# Patient Record
Sex: Male | Born: 1995 | Race: Black or African American | Hispanic: No | Marital: Single
Health system: Southern US, Community
[De-identification: ages and names within clinical notes are randomized; demographics above are authoritative.]

## PROBLEM LIST (undated history)

## (undated) DIAGNOSIS — J45909 Unspecified asthma, uncomplicated: Secondary | ICD-10-CM

---
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
## 2016-11-15 ENCOUNTER — Emergency Department (HOSPITAL_COMMUNITY)
Admission: EM | Admit: 2016-11-15 | Discharge: 2016-11-15 | Disposition: A | Discharge status: Home / Self Care | Payer: Medicaid Other | Attending: Emergency Medicine | Admitting: Emergency Medicine

## 2016-11-15 ENCOUNTER — Encounter (HOSPITAL_COMMUNITY): Payer: Self-pay | Admitting: Emergency Medicine

## 2016-11-15 DIAGNOSIS — W541XXA Struck by dog, initial encounter: Secondary | ICD-10-CM | POA: Insufficient documentation

## 2016-11-15 DIAGNOSIS — Y92013 Bedroom of single-family (private) house as the place of occurrence of the external cause: Secondary | ICD-10-CM | POA: Diagnosis not present

## 2016-11-15 DIAGNOSIS — S3802XA Crushing injury of scrotum and testis, initial encounter: Secondary | ICD-10-CM | POA: Diagnosis present

## 2016-11-15 DIAGNOSIS — Y939 Activity, unspecified: Secondary | ICD-10-CM | POA: Insufficient documentation

## 2016-11-15 DIAGNOSIS — Y999 Unspecified external cause status: Secondary | ICD-10-CM | POA: Diagnosis not present

## 2016-11-15 DIAGNOSIS — Z7722 Contact with and (suspected) exposure to environmental tobacco smoke (acute) (chronic): Secondary | ICD-10-CM | POA: Insufficient documentation

## 2016-11-15 DIAGNOSIS — Z79899 Other long term (current) drug therapy: Secondary | ICD-10-CM | POA: Insufficient documentation

## 2016-11-15 DIAGNOSIS — S3022XA Contusion of scrotum and testes, initial encounter: Secondary | ICD-10-CM | POA: Diagnosis not present

## 2016-11-15 DIAGNOSIS — J4520 Mild intermittent asthma, uncomplicated: Secondary | ICD-10-CM | POA: Insufficient documentation

## 2016-11-15 HISTORY — DX: Unspecified asthma, uncomplicated: J45.909

## 2016-11-15 MED ORDER — ALBUTEROL SULFATE HFA 108 (90 BASE) MCG/ACT IN AERS
2.0000 | INHALATION_SPRAY | Freq: Once | RESPIRATORY_TRACT | Status: AC
  Administered 2016-11-15: 2 via RESPIRATORY_TRACT
  Filled 2016-11-15: qty 6.7

## 2016-11-15 NOTE — ED Triage Notes (Signed)
Patient presents with mother c/o worsening asthma exacerbation since yesterday after mowing grass. Pt also adds testicular soreness since yesterday. Pt denies any trauma.
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
## 2017-01-05 DIAGNOSIS — J4540 Moderate persistent asthma, uncomplicated: Secondary | ICD-10-CM | POA: Insufficient documentation

## 2017-01-05 DIAGNOSIS — R002 Palpitations: Secondary | ICD-10-CM | POA: Diagnosis not present

## 2017-01-05 DIAGNOSIS — Z7722 Contact with and (suspected) exposure to environmental tobacco smoke (acute) (chronic): Secondary | ICD-10-CM | POA: Diagnosis not present

## 2017-01-06 ENCOUNTER — Emergency Department (HOSPITAL_COMMUNITY): Payer: Medicaid Other

## 2017-01-06 ENCOUNTER — Emergency Department (HOSPITAL_COMMUNITY)
Admission: EM | Admit: 2017-01-06 | Discharge: 2017-01-06 | Disposition: A | Discharge status: Home / Self Care | Payer: Medicaid Other | Attending: Emergency Medicine | Admitting: Emergency Medicine

## 2017-01-06 ENCOUNTER — Encounter (HOSPITAL_COMMUNITY): Payer: Self-pay | Admitting: Emergency Medicine

## 2017-01-06 DIAGNOSIS — R002 Palpitations: Secondary | ICD-10-CM

## 2017-01-06 LAB — BASIC METABOLIC PANEL
ANION GAP: 8 (ref 5–15)
BUN: 6 mg/dL (ref 6–20)
CHLORIDE: 104 mmol/L (ref 101–111)
CO2: 25 mmol/L (ref 22–32)
Calcium: 9.2 mg/dL (ref 8.9–10.3)
Creatinine, Ser: 0.91 mg/dL (ref 0.61–1.24)
GFR calc non Af Amer: >60 mL/min (ref >60)
Glucose, Bld: 122 mg/dL — ABNORMAL HIGH (ref 65–99)
POTASSIUM: 3.3 mmol/L — AB (ref 3.5–5.1)
SODIUM: 137 mmol/L (ref 135–145)

## 2017-01-06 LAB — CBC
HEMATOCRIT: 43.8 % (ref 39.0–52.0)
Hemoglobin: 14.4 g/dL (ref 13.0–17.0)
MCH: 25.8 pg — ABNORMAL LOW (ref 26.0–34.0)
MCHC: 32.9 g/dL (ref 30.0–36.0)
MCV: 78.5 fL (ref 78.0–100.0)
Platelets: 240 10*3/uL (ref 150–400)
RBC: 5.58 MIL/uL (ref 4.22–5.81)
RDW: 13.7 % (ref 11.5–15.5)
WBC: 4.3 10*3/uL (ref 4.0–10.5)

## 2017-01-06 LAB — I-STAT TROPONIN, ED: Troponin i, poc: 0 ng/mL (ref 0.00–0.08)

## 2017-01-06 MED ORDER — POTASSIUM CHLORIDE CRYS ER 20 MEQ PO TBCR
40.0000 meq | EXTENDED_RELEASE_TABLET | Freq: Once | ORAL | Status: AC
  Administered 2017-01-06: 40 meq via ORAL
  Filled 2017-01-06: qty 2

## 2017-01-06 NOTE — Discharge Instructions (Signed)
As discussed, make sure that he stay well-hydrated drink plenty of water to keep your urine clear. Follow-up with cardiology for possible Holter monitor to evaluate for palpitations.  Your EKG and cardiac enzymes were normal today.  Make sure to follow-up with your primary care provider to evaluate blood pressure under less stressful circumstances. Return to the emergency department if she experienced difficulty breathing, chest pain, palpitations or other concerning symptoms in the meantime.

## 2017-01-06 NOTE — ED Triage Notes (Signed)
Pt reports left sided chest pain tonight.  No history, denies loc, n/v/d, weakness and diaphoresis.  Does report a "little sob".

## 2017-01-06 NOTE — ED Notes (Signed)
Pt and family understood dc material. NAD Noted 
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
## 2017-03-04 ENCOUNTER — Encounter (HOSPITAL_COMMUNITY): Payer: Self-pay | Admitting: Emergency Medicine

## 2017-03-04 ENCOUNTER — Emergency Department (HOSPITAL_COMMUNITY)
Admission: EM | Admit: 2017-03-04 | Discharge: 2017-03-04 | Disposition: A | Discharge status: Home / Self Care | Payer: Medicaid Other | Attending: Emergency Medicine | Admitting: Emergency Medicine

## 2017-03-04 DIAGNOSIS — J45909 Unspecified asthma, uncomplicated: Secondary | ICD-10-CM | POA: Insufficient documentation

## 2017-03-04 DIAGNOSIS — Z7722 Contact with and (suspected) exposure to environmental tobacco smoke (acute) (chronic): Secondary | ICD-10-CM | POA: Insufficient documentation

## 2017-03-04 DIAGNOSIS — I10 Essential (primary) hypertension: Secondary | ICD-10-CM | POA: Insufficient documentation

## 2017-03-04 NOTE — ED Triage Notes (Signed)
Patient in from home. Mom is speaker. Reports that patient has been having increased blood pressure readings x 1 week. Natural OTC remedies, including vinegar and water, cinnamon, with no relief.
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
## 2017-03-04 NOTE — Discharge Instructions (Signed)
You were evaluated in the ED for high blood pressure readings at home. Your blood pressure here was slightly elevated, but your physical exam was reassuring and did not suggest evidence of end-organ damage as a result of high blood pressure.  Please read the attached instructions regarding the appropriate diet for managing high blood pressure at home. Please avoid foods that are high in salt and I recommend starting a daily exercise regimen at home. Please use your albuterol inhaler as needed. You need to use this inhaler when you feel short of breath. You do not need to use this inhaler every day.   Please call the family medicine clinic to re-establish care and to help manage your chronic medical conditions.

## 2019-04-03 IMAGING — CR DG CHEST 2V
2 series · 2 of 2 positions shown · non-contrast
Comparison: Prior radiograph from 06/05/2005.

CLINICAL DATA: Initial evaluation for acute chest pain, heart
racing.

EXAM:
CHEST  2 VIEW

[chest pa]
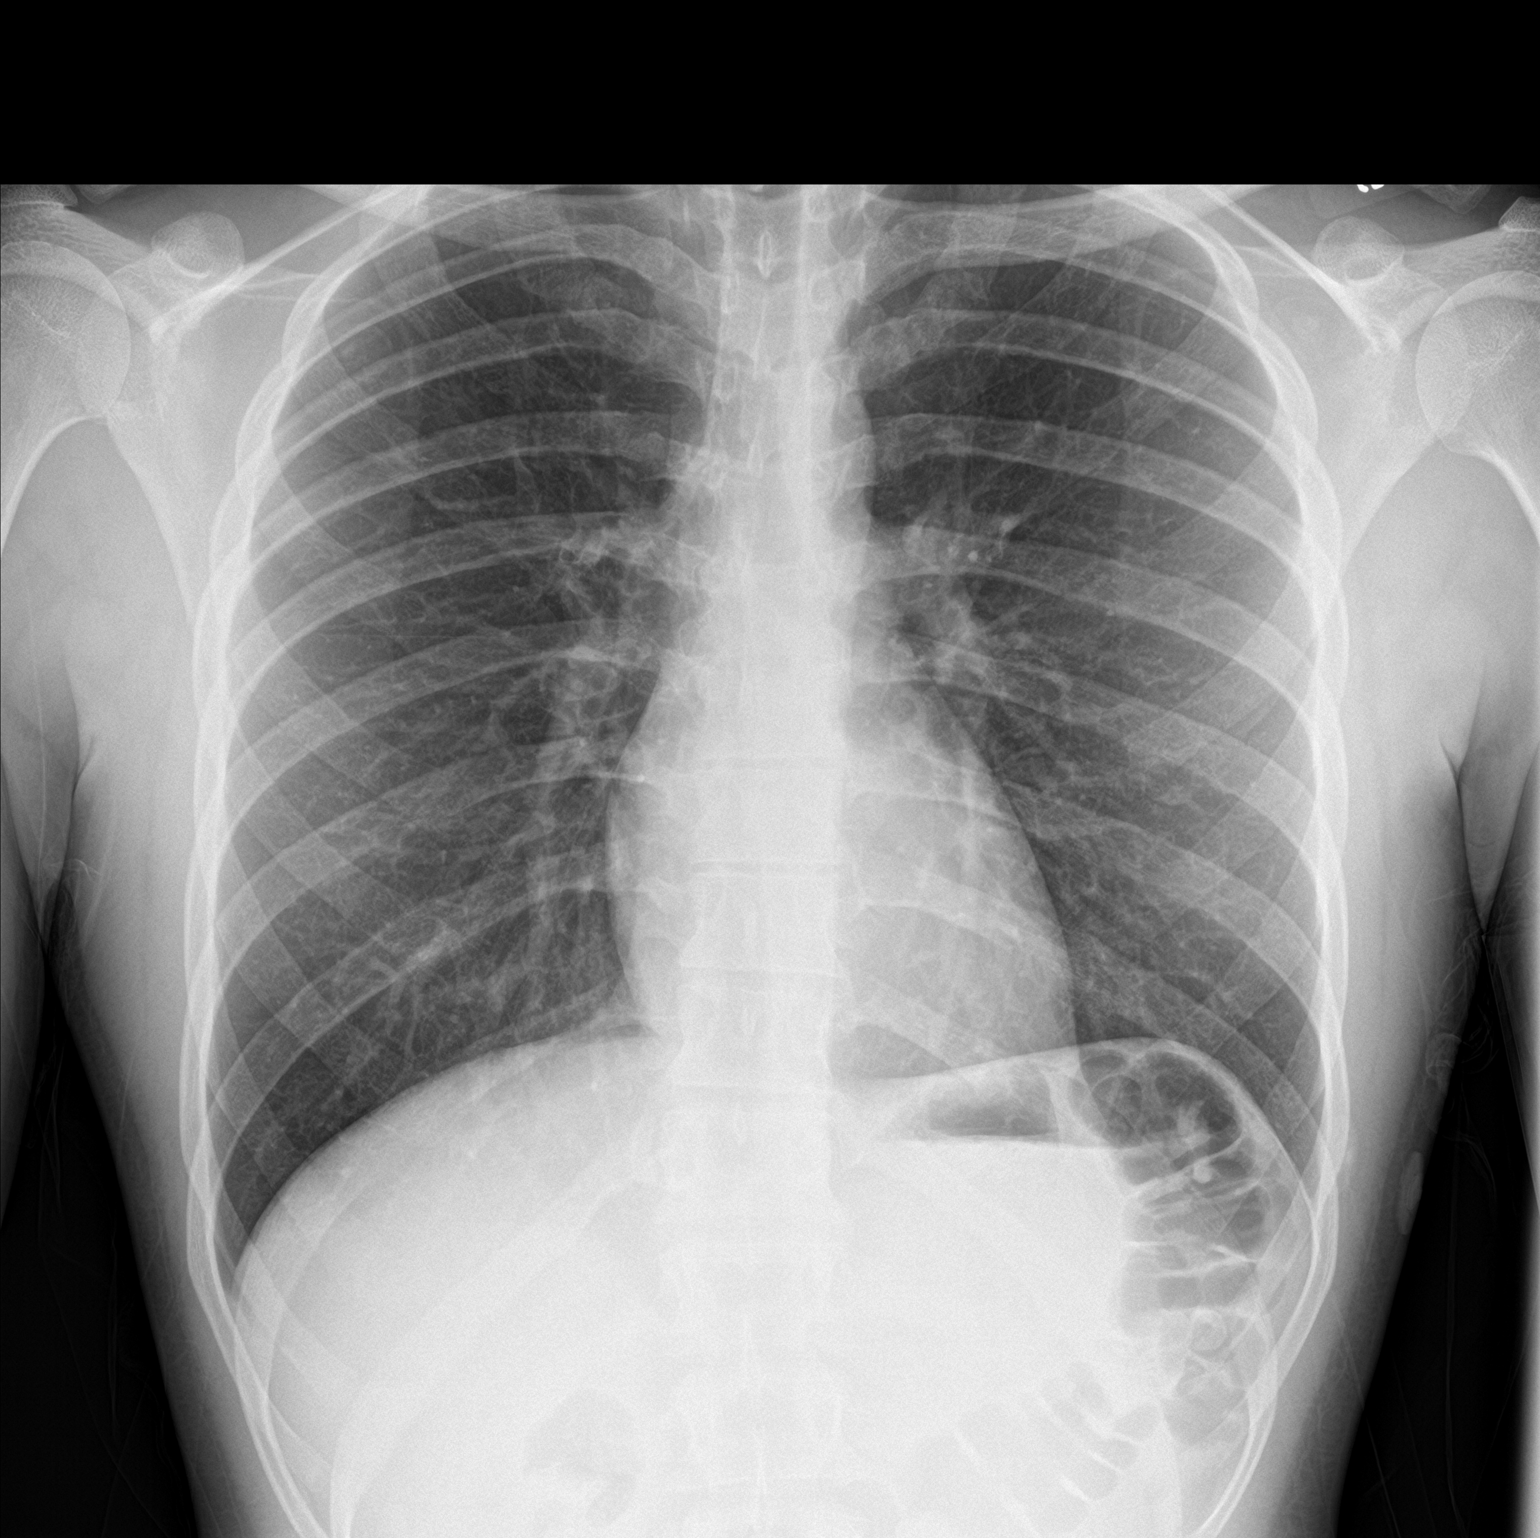

[chest lat]
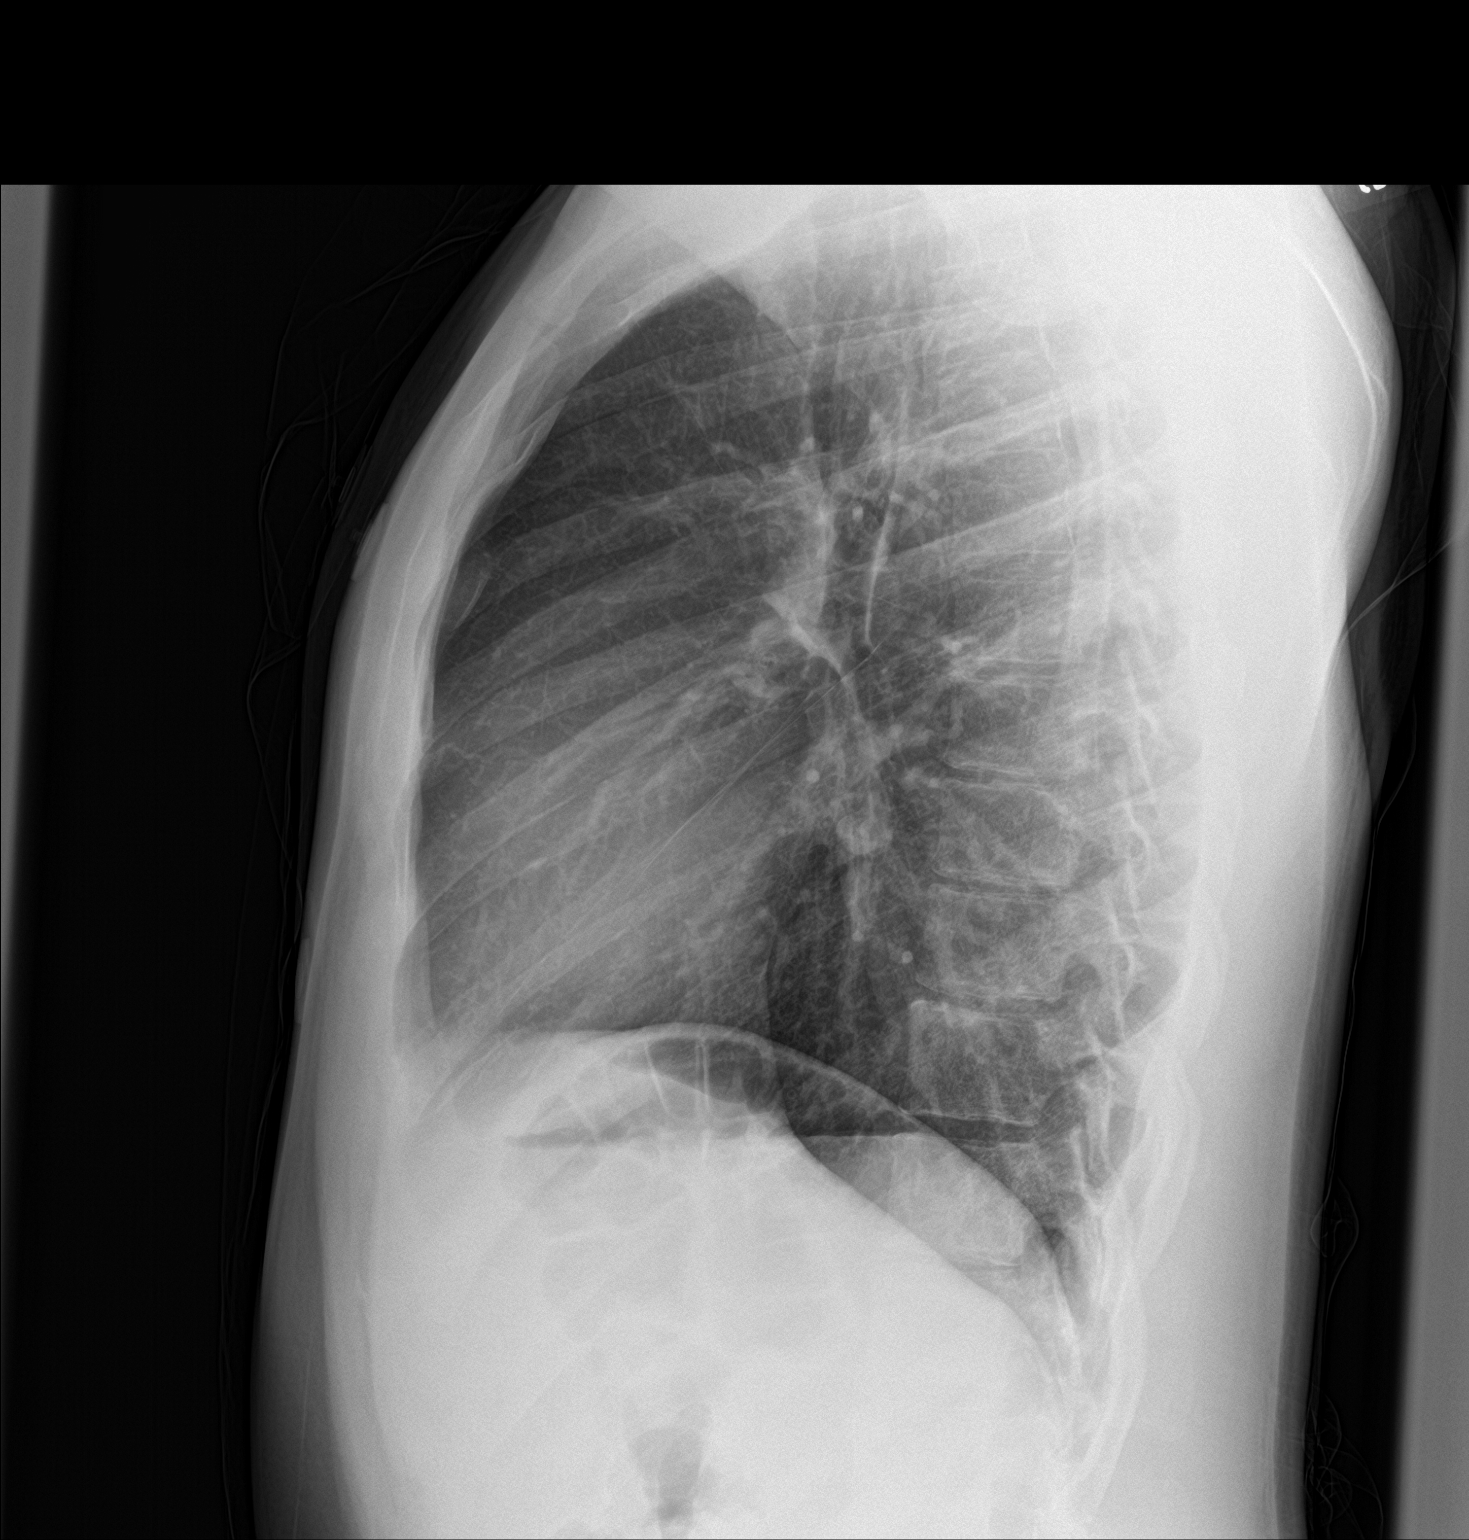

[2 of 2 positions shown; findings below may reference images not displayed]

FINDINGS: The cardiac and mediastinal silhouettes are stable in size and
contour, and remain within normal limits.

The lungs are normally inflated. No airspace consolidation, pleural
effusion, or pulmonary edema is identified. There is no
pneumothorax.

No acute osseous abnormality identified.
IMPRESSION: No active cardiopulmonary disease.

## 2019-10-11 DIAGNOSIS — F29 Unspecified psychosis not due to a substance or known physiological condition: Secondary | ICD-10-CM | POA: Diagnosis not present

## 2019-10-11 DIAGNOSIS — F840 Autistic disorder: Secondary | ICD-10-CM | POA: Diagnosis not present

## 2019-10-11 DIAGNOSIS — F411 Generalized anxiety disorder: Secondary | ICD-10-CM | POA: Diagnosis not present

## 2019-12-29 DIAGNOSIS — F411 Generalized anxiety disorder: Secondary | ICD-10-CM | POA: Diagnosis not present

## 2019-12-29 DIAGNOSIS — F840 Autistic disorder: Secondary | ICD-10-CM | POA: Diagnosis not present

## 2019-12-29 DIAGNOSIS — F29 Unspecified psychosis not due to a substance or known physiological condition: Secondary | ICD-10-CM | POA: Diagnosis not present

## 2020-03-22 DIAGNOSIS — F840 Autistic disorder: Secondary | ICD-10-CM | POA: Diagnosis not present

## 2020-03-22 DIAGNOSIS — F29 Unspecified psychosis not due to a substance or known physiological condition: Secondary | ICD-10-CM | POA: Diagnosis not present

## 2020-03-22 DIAGNOSIS — F411 Generalized anxiety disorder: Secondary | ICD-10-CM | POA: Diagnosis not present

## 2020-05-28 DIAGNOSIS — F411 Generalized anxiety disorder: Secondary | ICD-10-CM | POA: Diagnosis not present

## 2020-05-28 DIAGNOSIS — F29 Unspecified psychosis not due to a substance or known physiological condition: Secondary | ICD-10-CM | POA: Diagnosis not present

## 2020-05-28 DIAGNOSIS — F840 Autistic disorder: Secondary | ICD-10-CM | POA: Diagnosis not present

## 2020-08-07 DIAGNOSIS — F411 Generalized anxiety disorder: Secondary | ICD-10-CM | POA: Diagnosis not present

## 2020-08-07 DIAGNOSIS — F840 Autistic disorder: Secondary | ICD-10-CM | POA: Diagnosis not present

## 2020-08-07 DIAGNOSIS — F70 Mild intellectual disabilities: Secondary | ICD-10-CM | POA: Diagnosis not present

## 2020-08-07 DIAGNOSIS — F29 Unspecified psychosis not due to a substance or known physiological condition: Secondary | ICD-10-CM | POA: Diagnosis not present

## 2020-10-31 DIAGNOSIS — F411 Generalized anxiety disorder: Secondary | ICD-10-CM | POA: Diagnosis not present

## 2020-10-31 DIAGNOSIS — F29 Unspecified psychosis not due to a substance or known physiological condition: Secondary | ICD-10-CM | POA: Diagnosis not present

## 2020-10-31 DIAGNOSIS — F840 Autistic disorder: Secondary | ICD-10-CM | POA: Diagnosis not present

## 2021-03-27 DIAGNOSIS — F29 Unspecified psychosis not due to a substance or known physiological condition: Secondary | ICD-10-CM | POA: Diagnosis not present

## 2021-03-27 DIAGNOSIS — F840 Autistic disorder: Secondary | ICD-10-CM | POA: Diagnosis not present

## 2021-10-15 DIAGNOSIS — F840 Autistic disorder: Secondary | ICD-10-CM | POA: Diagnosis not present

## 2021-10-15 DIAGNOSIS — F29 Unspecified psychosis not due to a substance or known physiological condition: Secondary | ICD-10-CM | POA: Diagnosis not present

## 2021-10-15 DIAGNOSIS — F411 Generalized anxiety disorder: Secondary | ICD-10-CM | POA: Diagnosis not present

## 2022-04-23 DIAGNOSIS — F840 Autistic disorder: Secondary | ICD-10-CM | POA: Diagnosis not present

## 2022-04-23 DIAGNOSIS — F29 Unspecified psychosis not due to a substance or known physiological condition: Secondary | ICD-10-CM | POA: Diagnosis not present

## 2022-04-23 DIAGNOSIS — F411 Generalized anxiety disorder: Secondary | ICD-10-CM | POA: Diagnosis not present

## 2022-12-16 DIAGNOSIS — F29 Unspecified psychosis not due to a substance or known physiological condition: Secondary | ICD-10-CM | POA: Diagnosis not present

## 2022-12-16 DIAGNOSIS — F840 Autistic disorder: Secondary | ICD-10-CM | POA: Diagnosis not present

## 2022-12-16 DIAGNOSIS — F411 Generalized anxiety disorder: Secondary | ICD-10-CM | POA: Diagnosis not present

## 2023-08-20 DIAGNOSIS — F411 Generalized anxiety disorder: Secondary | ICD-10-CM | POA: Diagnosis not present

## 2023-08-20 DIAGNOSIS — F840 Autistic disorder: Secondary | ICD-10-CM | POA: Diagnosis not present
